# Patient Record
Sex: Female | Born: 1950 | Race: White | Hispanic: No | Marital: Single
Health system: Southern US, Community
[De-identification: ages and names within clinical notes are randomized; demographics above are authoritative.]

---

## 2010-05-10 ENCOUNTER — Emergency Department (HOSPITAL_COMMUNITY): Admission: EM | Admit: 2010-05-10 | Discharge: 2010-05-10 | Payer: Self-pay | Admitting: Emergency Medicine

## 2010-12-05 LAB — DIFFERENTIAL
Basophils Relative: 0 % (ref 0–1)
Eosinophils Absolute: 0.1 10*3/uL (ref 0.0–0.7)
Eosinophils Relative: 1 % (ref 0–5)
Monocytes Absolute: 1 10*3/uL (ref 0.1–1.0)
Monocytes Relative: 11 % (ref 3–12)
Neutro Abs: 6.9 10*3/uL (ref 1.7–7.7)

## 2010-12-05 LAB — BASIC METABOLIC PANEL
BUN: 5 mg/dL — ABNORMAL LOW (ref 6–23)
CO2: 29 mEq/L (ref 19–32)
Calcium: 9.2 mg/dL (ref 8.4–10.5)
GFR calc non Af Amer: >60 mL/min (ref >60)
Sodium: 140 mEq/L (ref 135–145)

## 2010-12-05 LAB — CBC
HCT: 43.9 % (ref 36.0–46.0)
MCH: 31.5 pg (ref 26.0–34.0)
MCV: 90.9 fL (ref 78.0–100.0)
RBC: 4.83 MIL/uL (ref 3.87–5.11)

## 2011-04-18 IMAGING — CT CT MAXILLOFACIAL W/ CM
3 of 5 series · 16 of 47 positions shown, 19 images · IV contrast (agent unspecified)
Comparison: None

CLINICAL DATA: Left-sided facial swelling.  Tooth ache.

CT MAXILLOFACIAL WITH CONTRAST
TECHNIQUE: Multidetector CT imaging of the maxillofacial
structures was performed with intravenous contrast. Multiplanar CT
image reconstructions were also generated.
Contrast: 100 ml Zmnipaque-5SS

[Series 3: orbit/facial 2.0 h30s · axial · 0.36mm/px · z∈[-202,-72]mm · 10 of 77 slices shown, 13 images]
[im 6/77  brain]
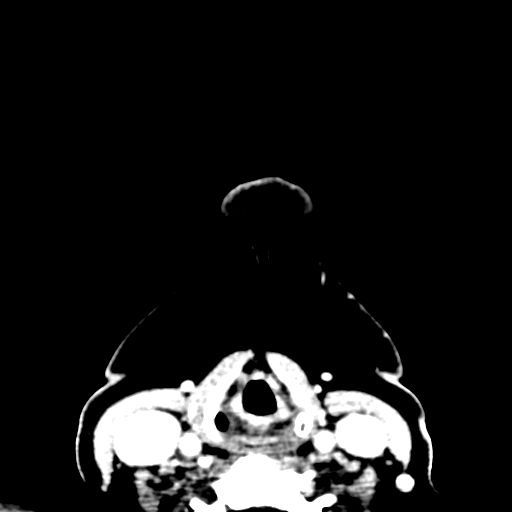
[im 6/77  bone]
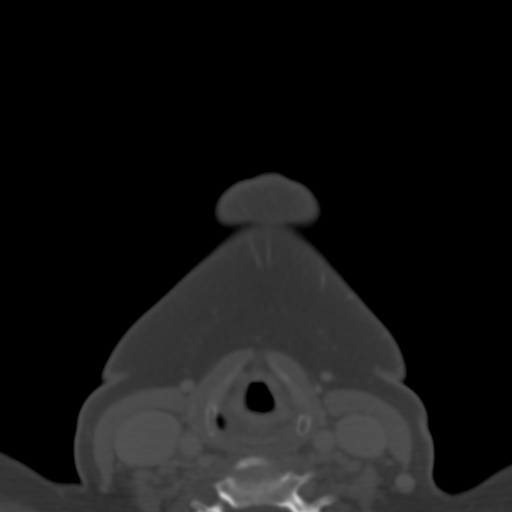
[im 11/77  bone]
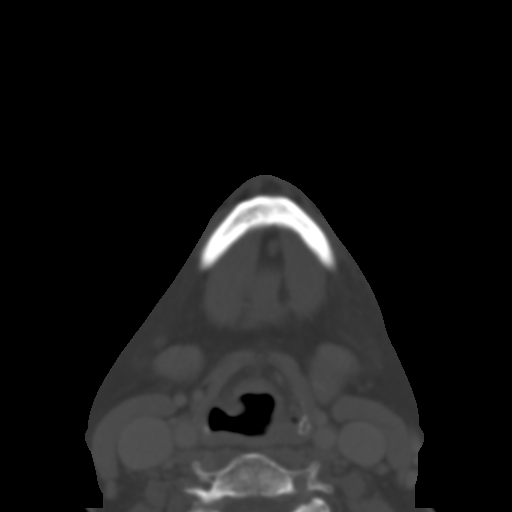
[im 22/77  bone]
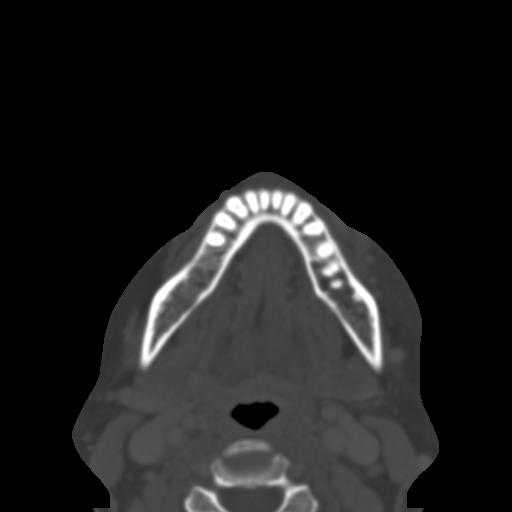
[im 28/77  bone]
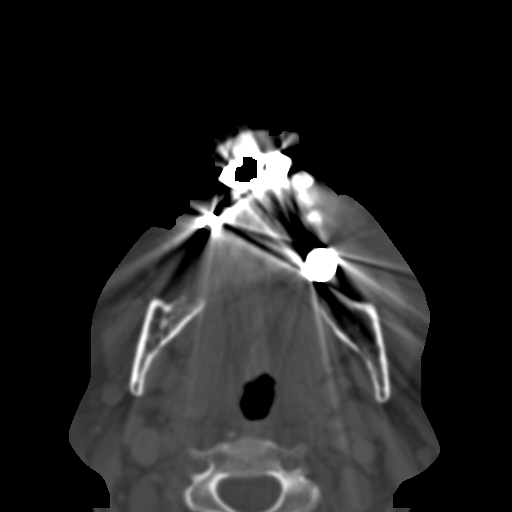
[im 33/77  brain]
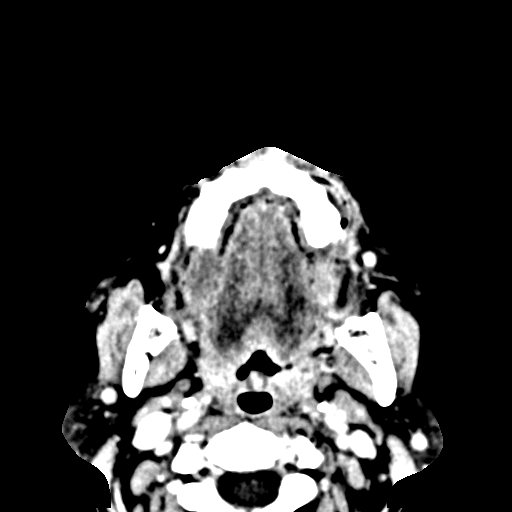
[im 33/77  bone]
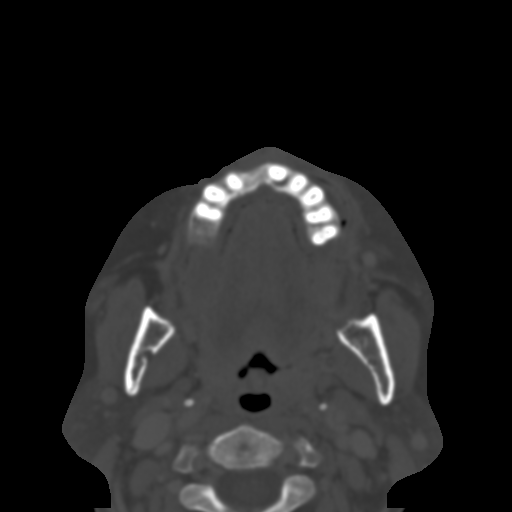
[im 44/77  bone]
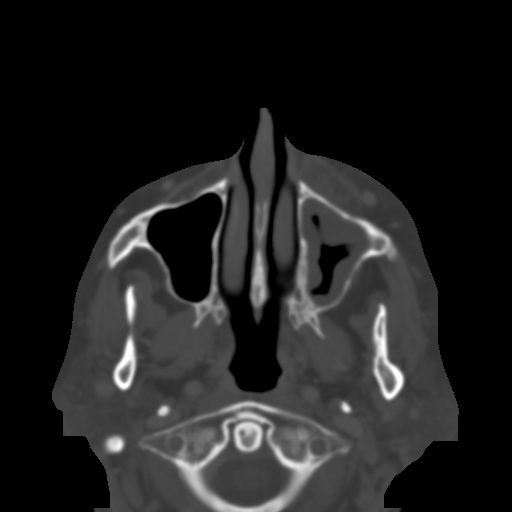
[im 49/77  bone]
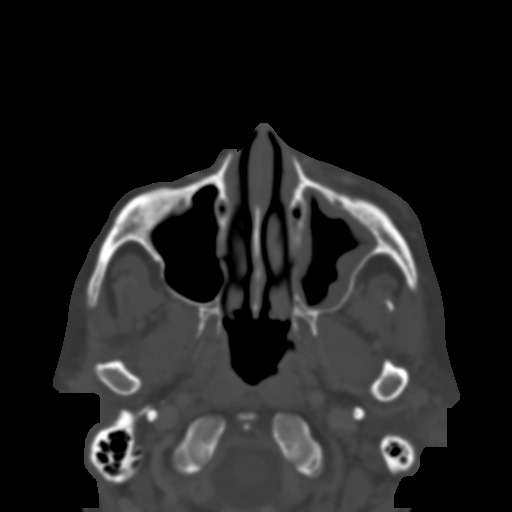
[im 55/77  bone]
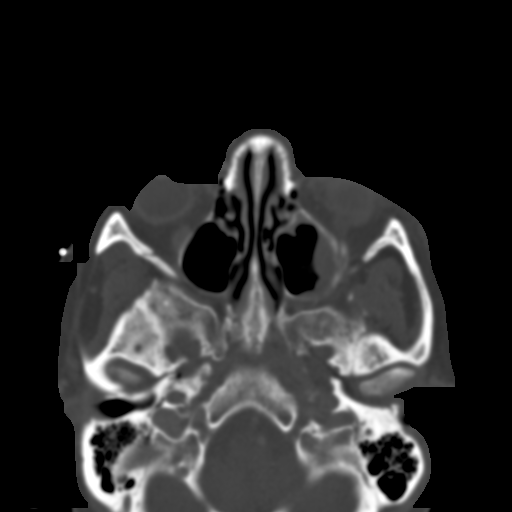
[im 66/77  brain]
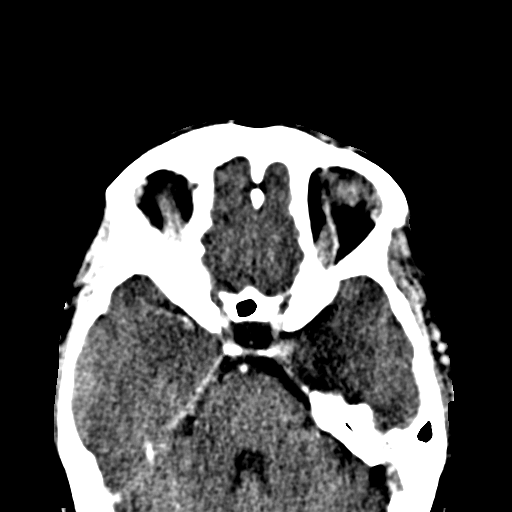
[im 66/77  bone]
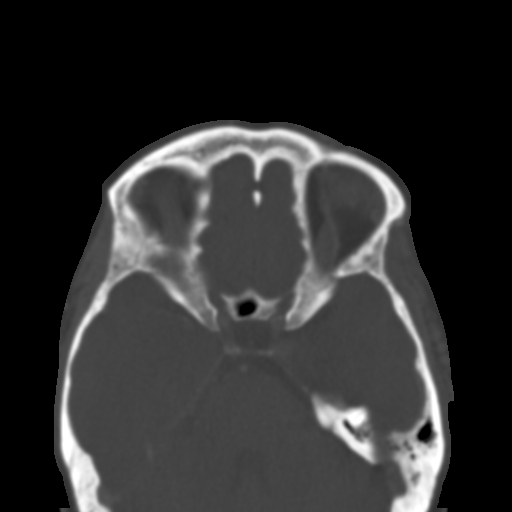
[im 71/77  bone]
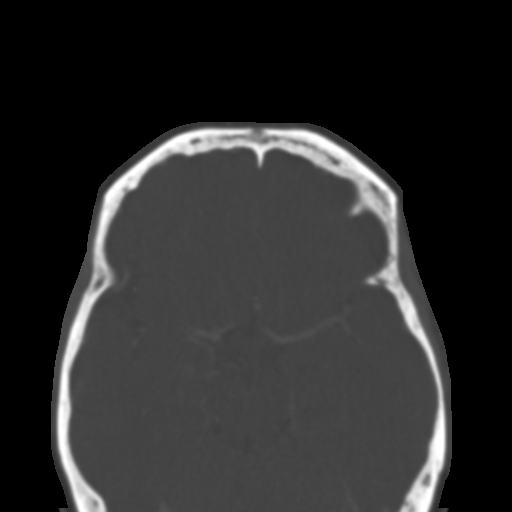

[Series 602: cor bone · coronal · 0.36mm/px · 3 of 71 slices shown]
[im 18/71  bone]
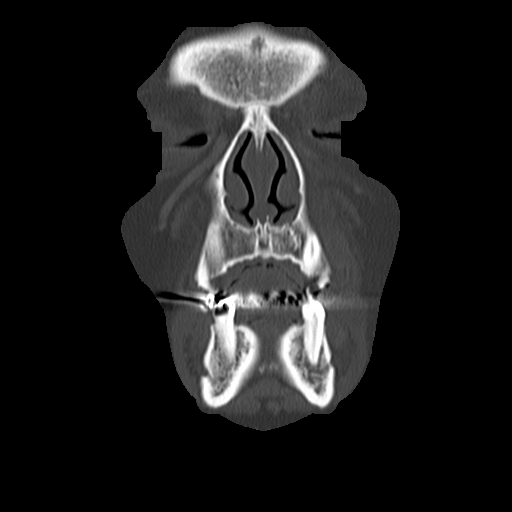
[im 36/71  bone]
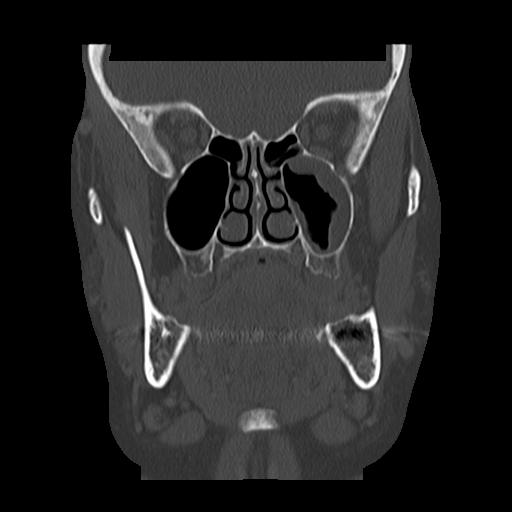
[im 53/71  bone]
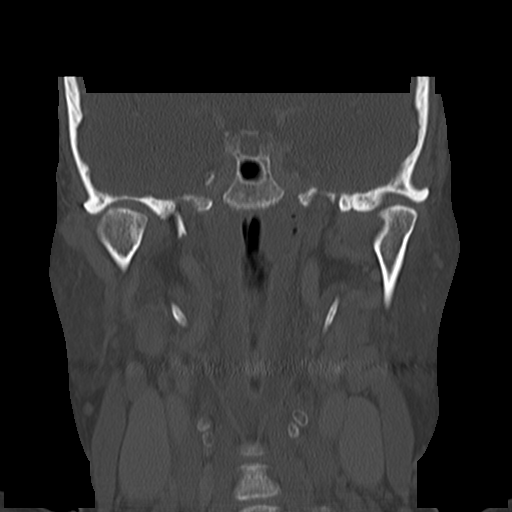

[Series 605: sag st · sagittal · 0.36mm/px · 3 of 73 slices shown]
[im 25/73  bone]
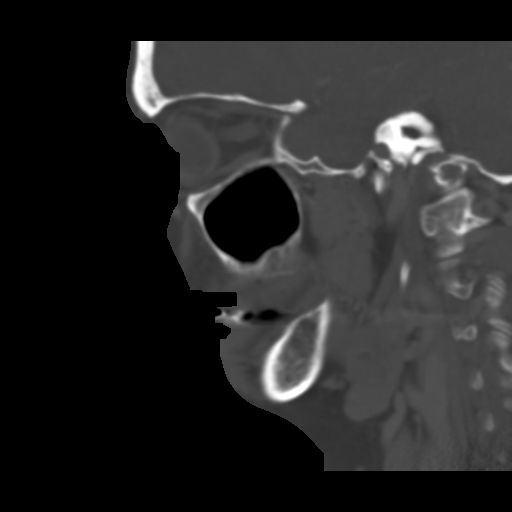
[im 37/73  bone]
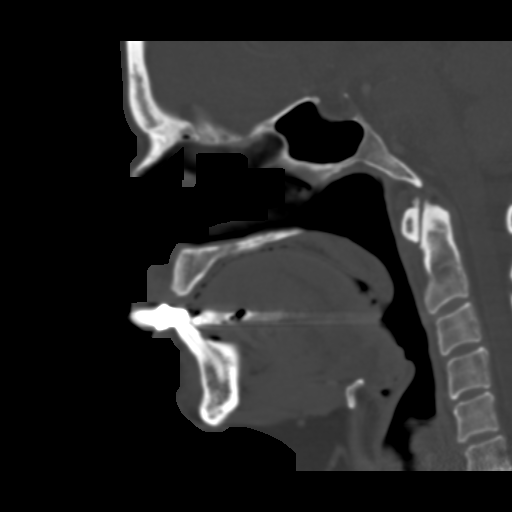
[im 49/73  bone]
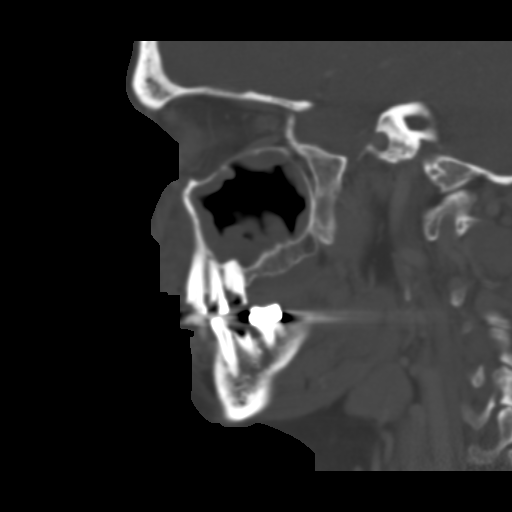

[16 of 47 positions shown; findings below may reference images not displayed]

FINDINGS: The patient has widespread dental and periodontal disease
with multiple caries.  With respect to the left maxillary
dentition, the roots of tooth number 13 shows some peri apical
lucency with penetration into the floor of the maxillary sinus.
There is diffuse mucosal inflammation of the left maxillary sinus
but no free fluid.  I think there is a small soft tissue abscess
along the lateral margin of the maxillary dentition in this region.
This measures 5 x 10 mm.

No other regional finding of significance.
IMPRESSION: Widespread dental and periodontal disease.  Multiple caries.  Roots
of tooth number 13 penetrate into the left maxillary sinus floor.
Diffuse maxillary mucosal inflammation is present, possibly related
at least in part to this.

Small soft tissue abscess superficial to that region measuring
about 5 x 10 mm.

## 2011-07-17 ENCOUNTER — Ambulatory Visit: Payer: Self-pay

## 2012-06-24 IMAGING — CR DG KNEE 1-2V*R*
1 series · 2 of 2 positions shown · non-contrast
Comparison: none

REASON FOR EXAM: knee pain,migraines fax report [REDACTED]
COMMENTS:

PROCEDURE:     DXR - DXR KNEE RIGHT AP AND LATERAL  - July 17, 2011  [DATE]
RESULT:     Images of the right knee demonstrate no fracture, dislocation or
radiopaque foreign body.

[Series 1: t knee ap right · 0.14mm/px · 2 of 2 slices shown]
[im 1/2]
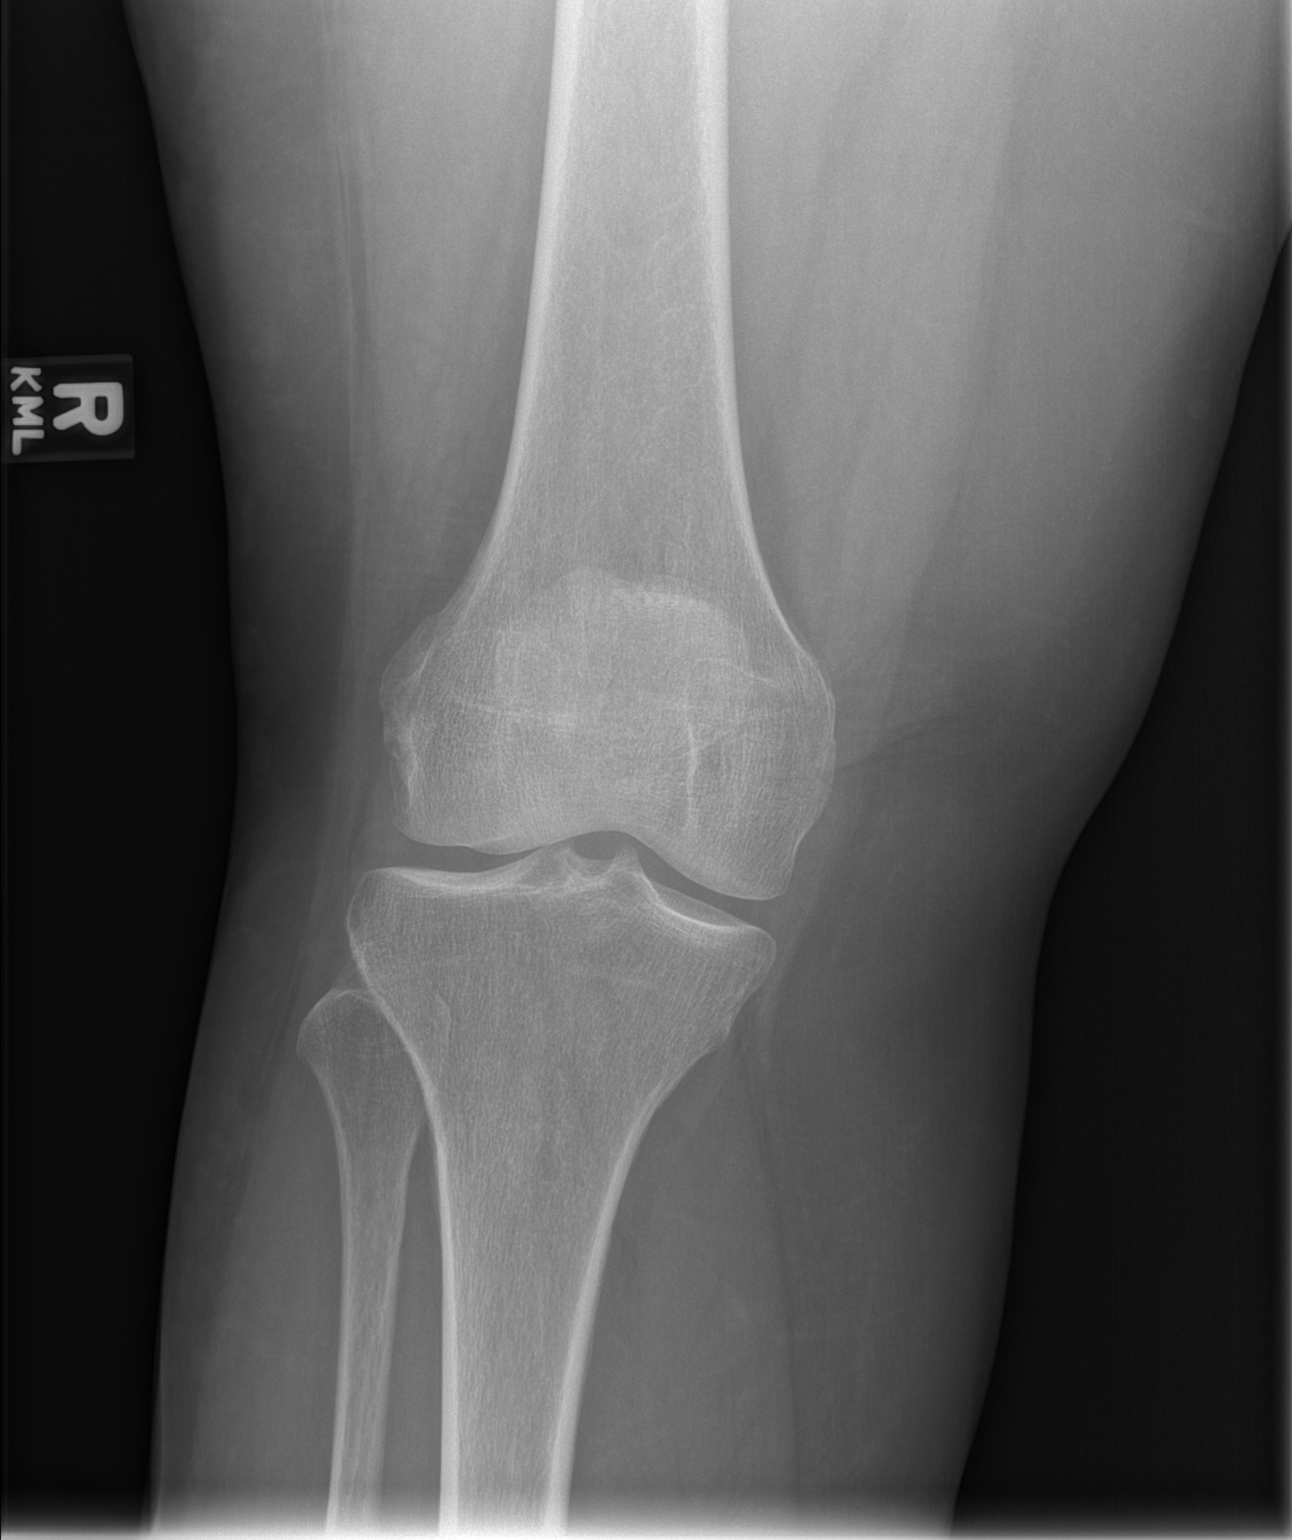
[im 2/2]
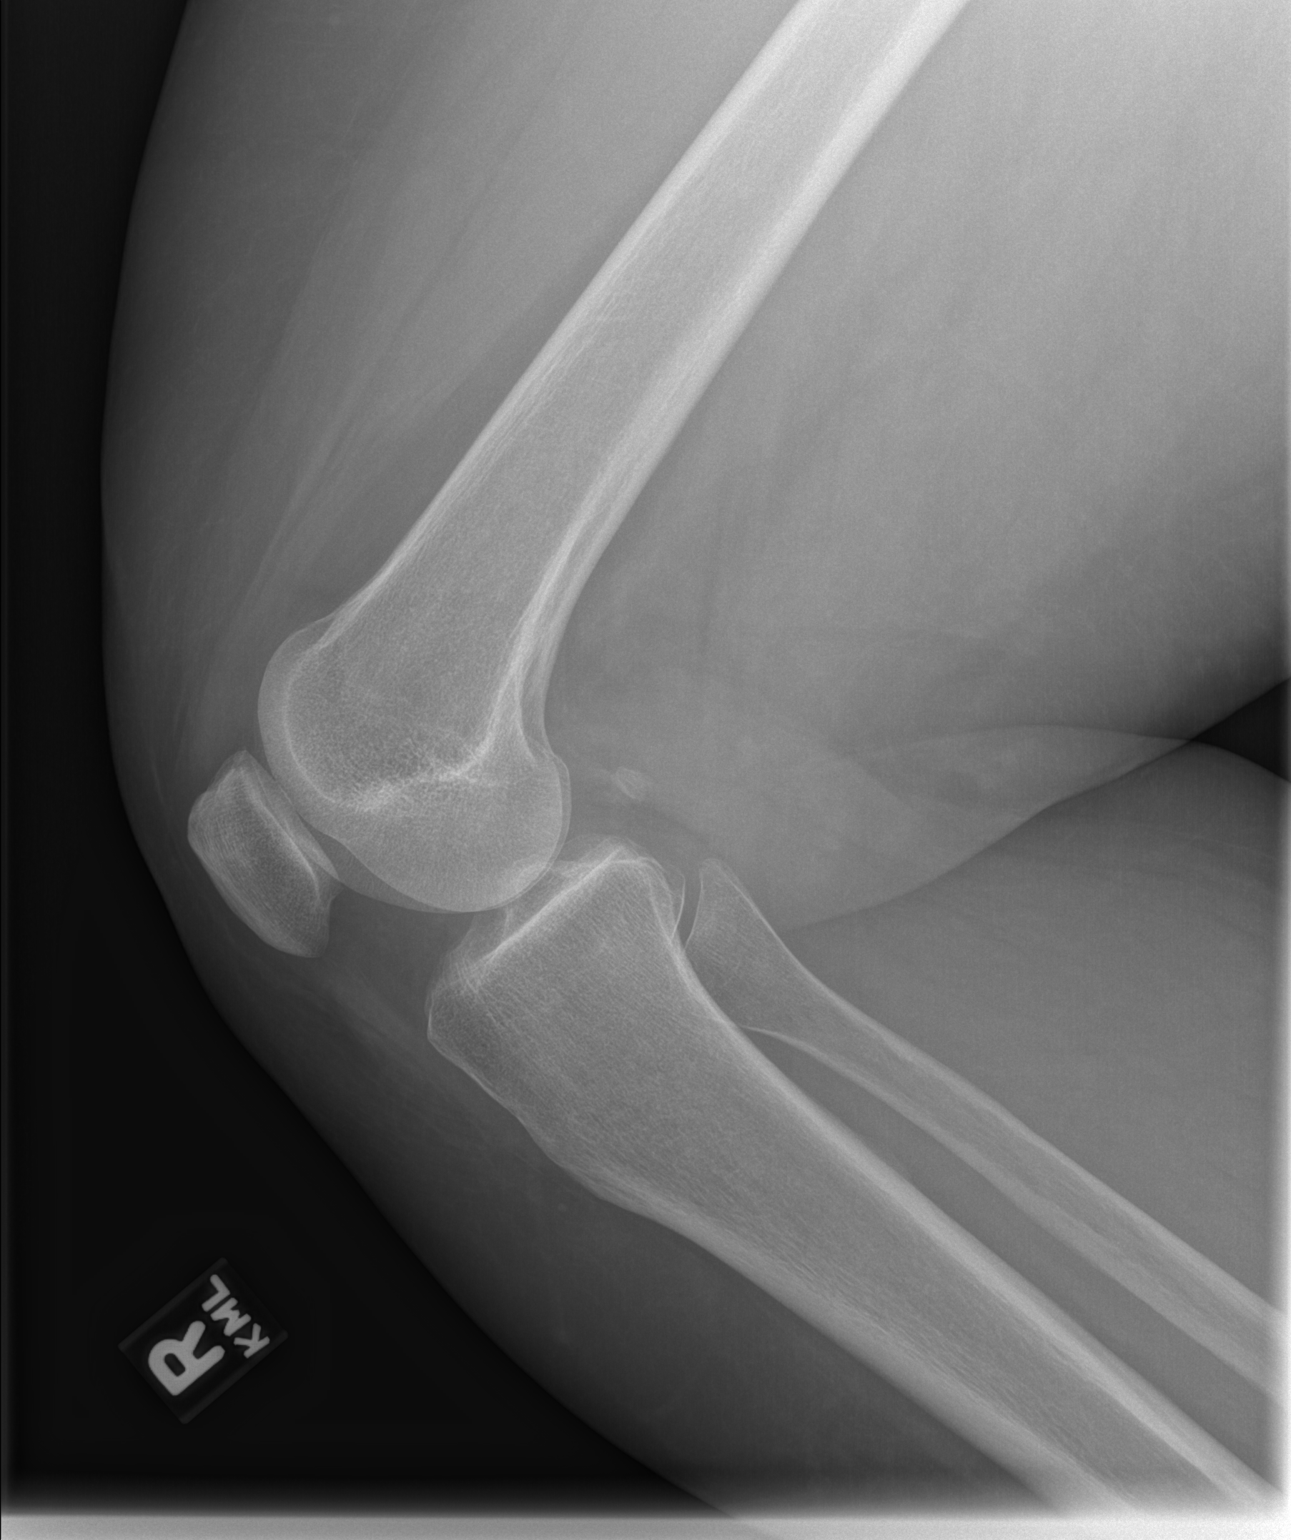

[2 of 2 positions shown; findings below may reference images not displayed]

IMPRESSION: Please see above.

## 2020-05-07 ENCOUNTER — Telehealth: Payer: Self-pay

## 2020-05-07 NOTE — Telephone Encounter (Signed)
Received referral for initial lung cancer screening scan from Dr. Gwyneth Sprout. Contacted patient and left message for patient to call Glenna Fellows, lung navigator to learn about program and schedule CT scan.

## 2020-06-03 ENCOUNTER — Telehealth: Payer: Self-pay | Admitting: *Deleted

## 2020-06-03 NOTE — Telephone Encounter (Signed)
Received referral for low dose lung cancer screening CT scan. Message left at phone number listed in EMR for patient to call me back to facilitate scheduling scan.  

## 2020-06-12 ENCOUNTER — Telehealth: Payer: Self-pay

## 2020-06-12 NOTE — Telephone Encounter (Signed)
Contacted patient for lung CT screening clinic based on referral from [employer].  Message left for patient to call Glenna Fellows, lung navigator to schedule CT scan.

## 2020-07-08 ENCOUNTER — Ambulatory Visit: Payer: Medicare Other | Admitting: Internal Medicine

## 2020-07-08 ENCOUNTER — Encounter: Payer: Self-pay | Admitting: *Deleted

## 2020-07-31 ENCOUNTER — Ambulatory Visit: Payer: Medicare HMO | Admitting: Internal Medicine
